# Patient Record
Sex: Male | Born: 1995 | Race: White | Hispanic: No | Marital: Single | Smoking: Never smoker
Health system: Southern US, Community
[De-identification: ages and names within clinical notes are randomized; demographics above are authoritative.]

## PROBLEM LIST (undated history)

## (undated) DIAGNOSIS — E669 Obesity, unspecified: Secondary | ICD-10-CM

---
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
## 2016-01-05 IMAGING — CR DG HAND COMPLETE 3+V*R*
3 series · 3 of 3 positions shown · non-contrast
Comparison: None.

CLINICAL DATA: Hand injury.

EXAM:
RIGHT HAND - COMPLETE 3+ VIEW

[x hand pa right]
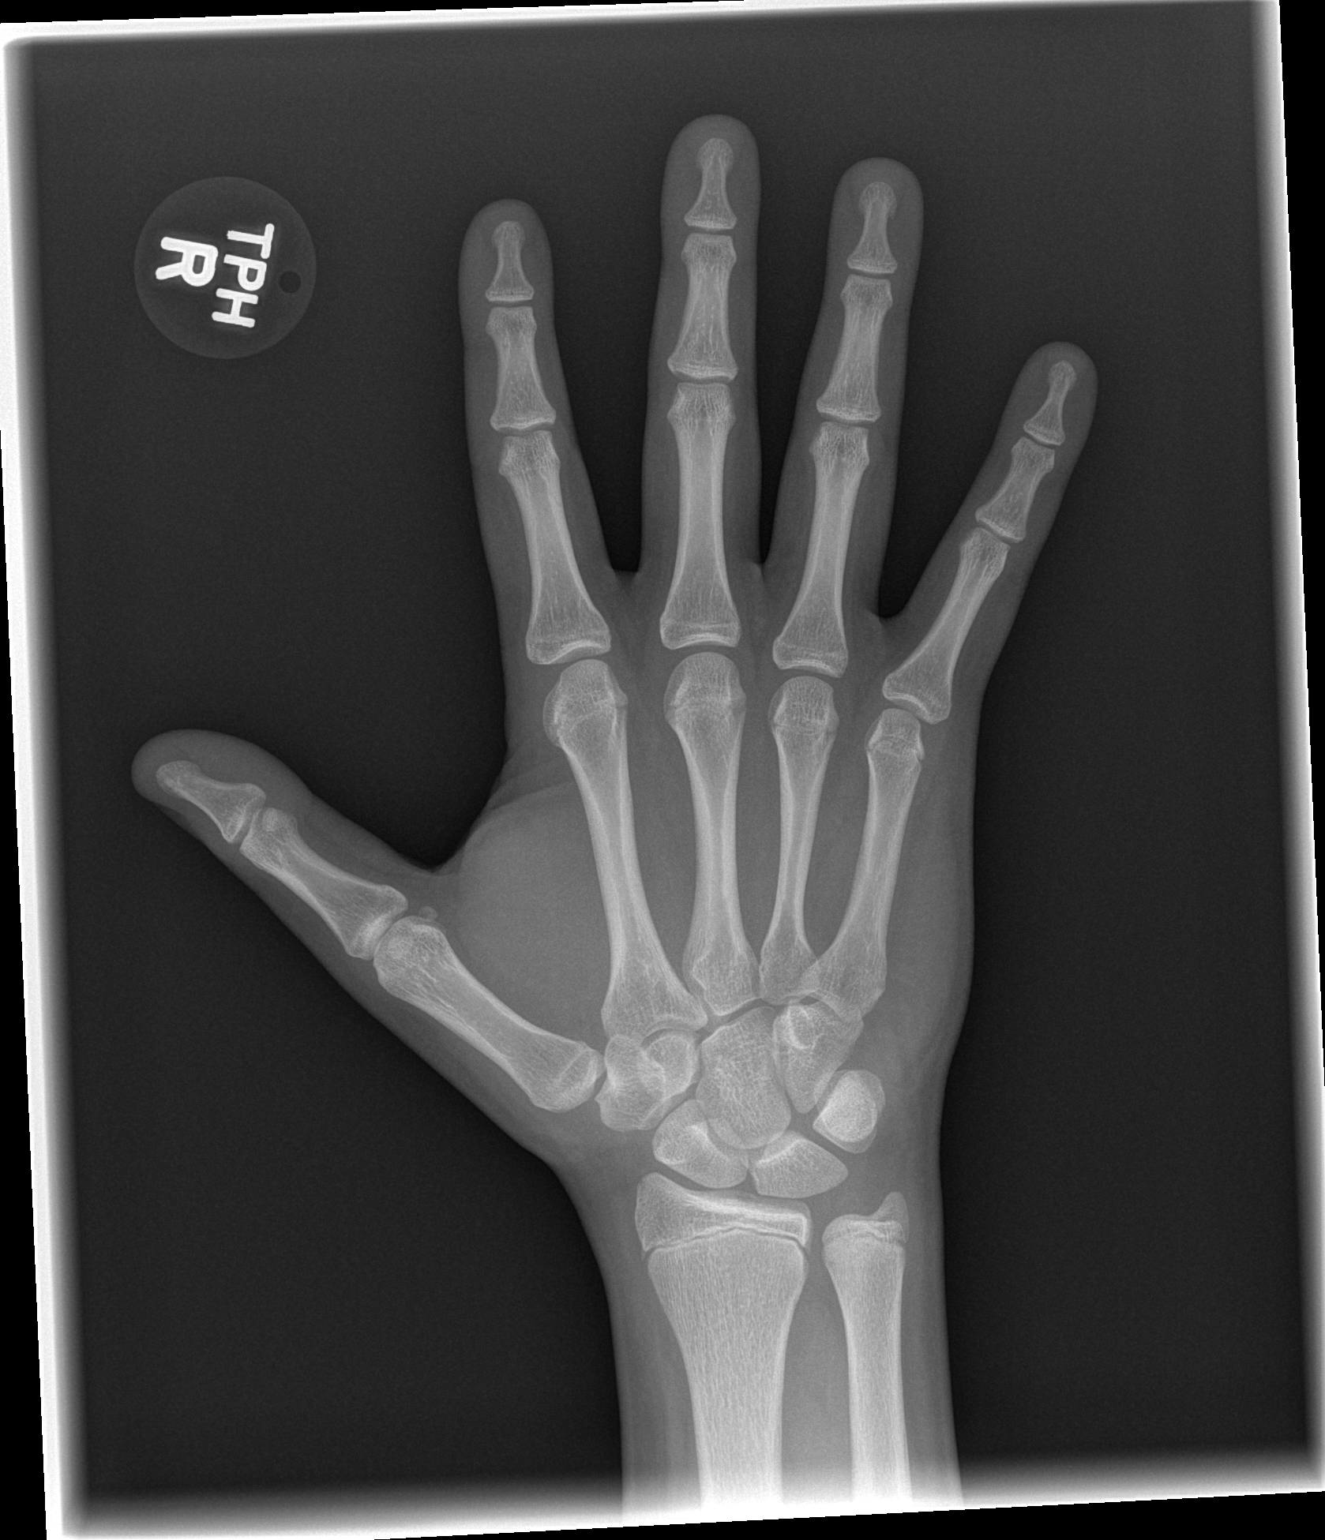

[x hand oblique right]
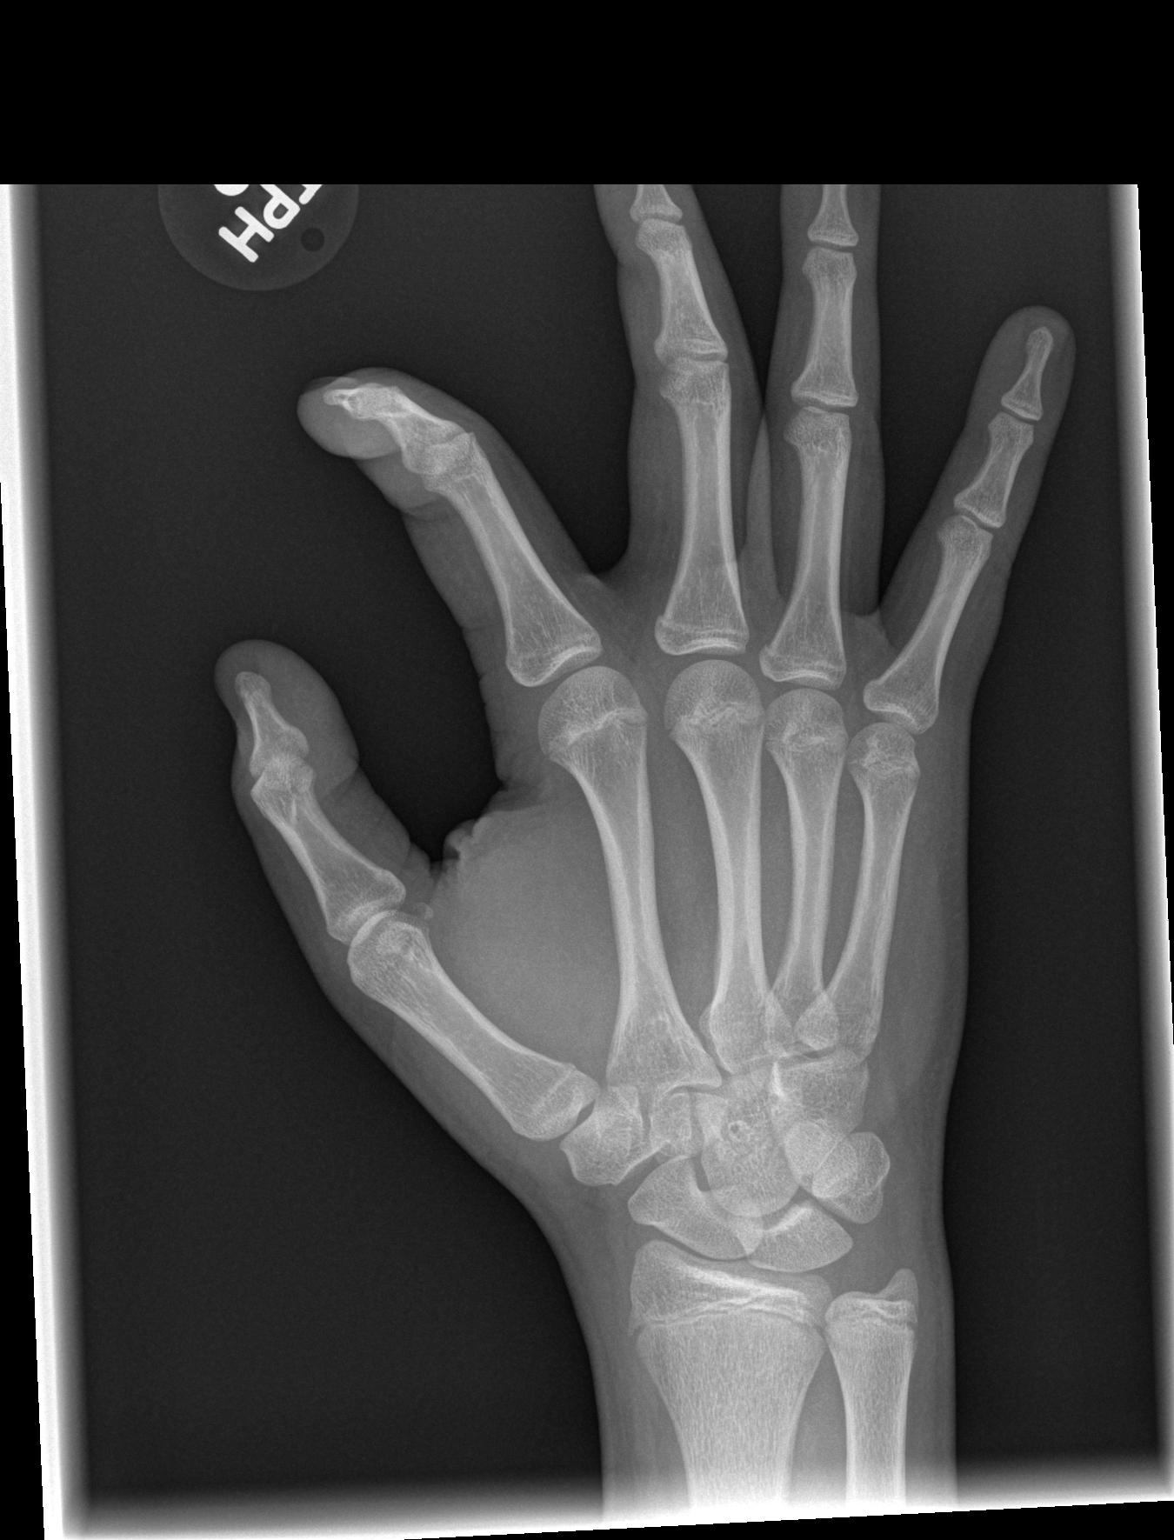

[x hand lat right]
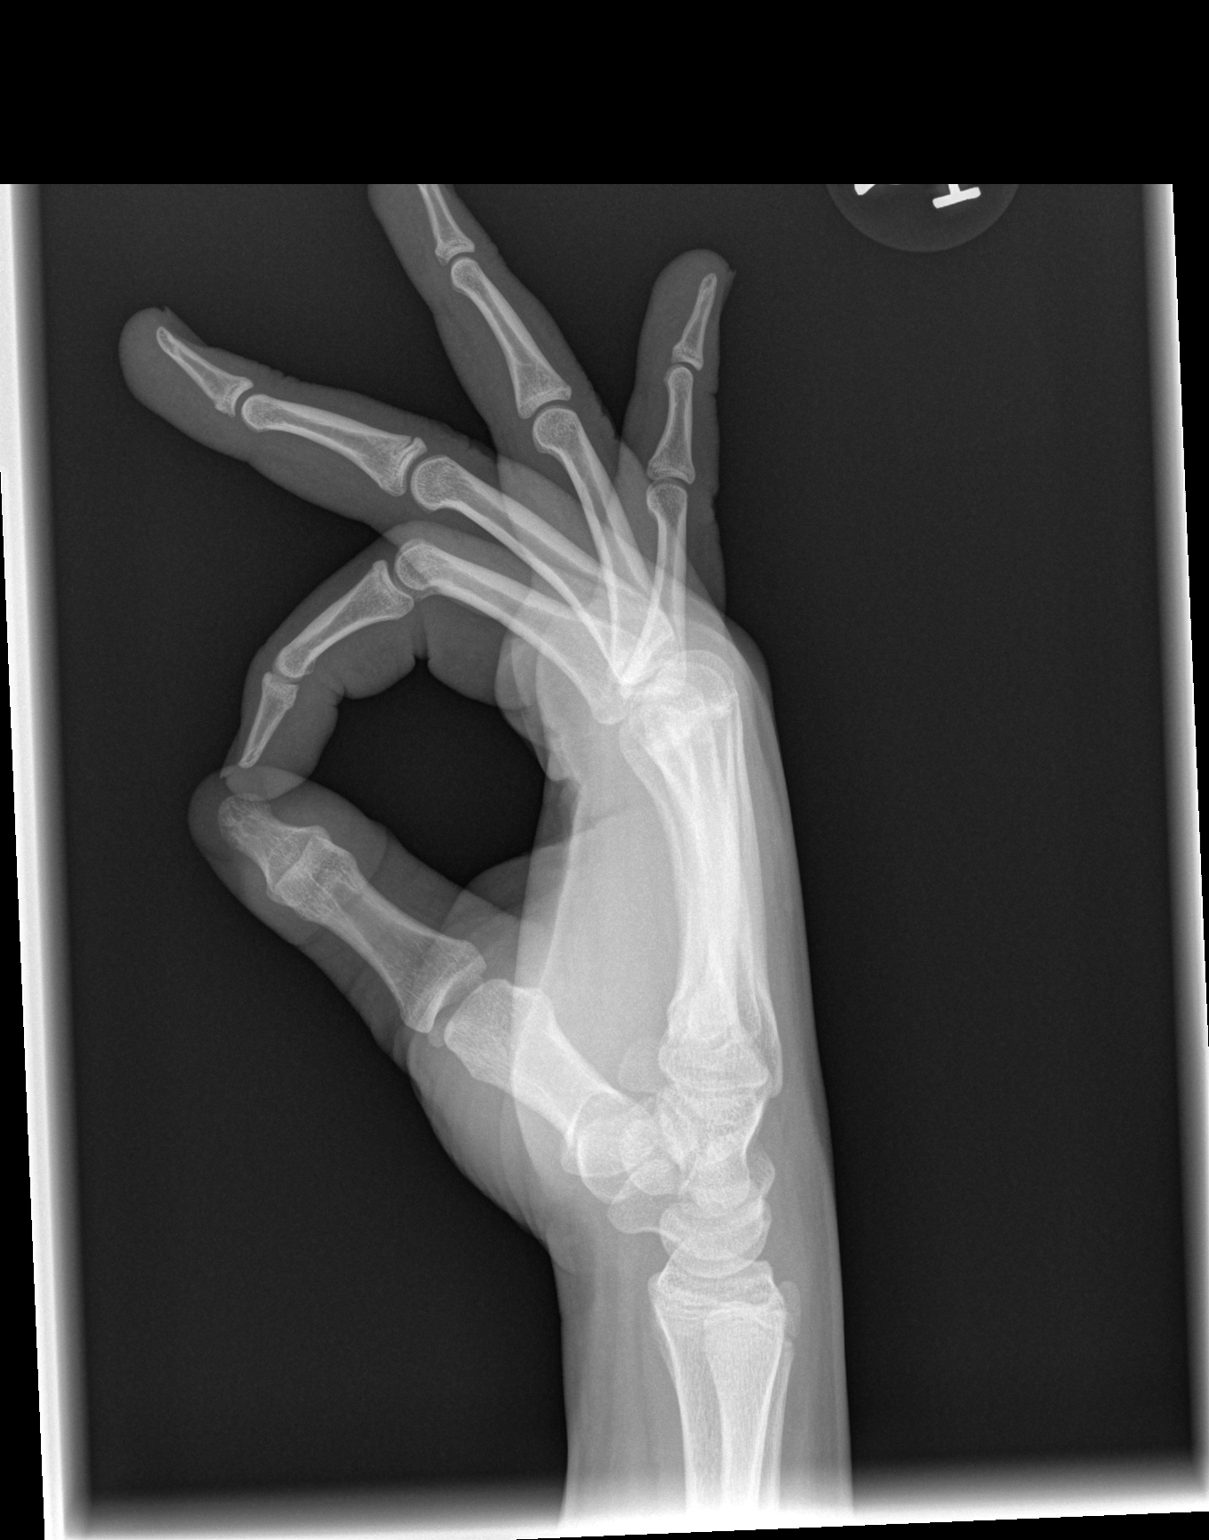

[3 of 3 positions shown; findings below may reference images not displayed]

FINDINGS: There is no evidence of acute fracture or dislocation. Noted physis
remnant in the third middle phalanx. No radiopaque foreign body.
IMPRESSION: No acute osseous abnormality.

## 2016-02-11 ENCOUNTER — Encounter: Payer: Self-pay | Admitting: Pediatrics

## 2016-02-12 ENCOUNTER — Encounter: Payer: Self-pay | Admitting: Pediatrics

## 2019-03-15 ENCOUNTER — Other Ambulatory Visit: Payer: Self-pay

## 2019-03-15 ENCOUNTER — Encounter: Payer: Self-pay | Admitting: Emergency Medicine

## 2019-03-15 ENCOUNTER — Emergency Department
Admission: EM | Admit: 2019-03-15 | Discharge: 2019-03-15 | Disposition: A | Discharge status: Home / Self Care | Payer: Medicaid Other | Attending: Emergency Medicine | Admitting: Emergency Medicine

## 2019-03-15 DIAGNOSIS — X500XXA Overexertion from strenuous movement or load, initial encounter: Secondary | ICD-10-CM | POA: Diagnosis not present

## 2019-03-15 DIAGNOSIS — Y9389 Activity, other specified: Secondary | ICD-10-CM | POA: Diagnosis not present

## 2019-03-15 DIAGNOSIS — S339XXA Sprain of unspecified parts of lumbar spine and pelvis, initial encounter: Secondary | ICD-10-CM | POA: Insufficient documentation

## 2019-03-15 DIAGNOSIS — S39012A Strain of muscle, fascia and tendon of lower back, initial encounter: Secondary | ICD-10-CM

## 2019-03-15 DIAGNOSIS — Y990 Civilian activity done for income or pay: Secondary | ICD-10-CM | POA: Diagnosis not present

## 2019-03-15 DIAGNOSIS — S3992XA Unspecified injury of lower back, initial encounter: Secondary | ICD-10-CM | POA: Diagnosis present

## 2019-03-15 DIAGNOSIS — Y9259 Other trade areas as the place of occurrence of the external cause: Secondary | ICD-10-CM | POA: Diagnosis not present

## 2019-03-15 HISTORY — DX: Obesity, unspecified: E66.9

## 2019-03-15 MED ORDER — BACLOFEN 5 MG PO TABS: 5.0000 mg | ORAL_TABLET | Freq: Three times a day (TID) | ORAL | 0 refills | Status: AC | PRN

## 2019-03-15 NOTE — ED Provider Notes (Signed)
[facility] Emergency Department Provider Note  ____________________________________________   None    (approximate)  I have reviewed the triage vital signs and the nursing notes.   HISTORY  Chief Complaint Back Pain    HPI Troy Morgan is a 23 y.o. male with no chronic medical issues other than obesity  who presents for evaluation of back pain.  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  However about 24 hours ago after working as shift he felt acute onset of worse back pain in the bottom of his lower back.  It is mostly in the middle and nonradiating.  No numbness or weakness in his legs, no numbness around his anus or genitals, no difficulty with ambulation, no difficulty with urination or urinary retention.  The pain feels little bit better when he takes ibuprofen or pushes on the area but he was also concerned he might have some sort of a lump at the bottom of his lower back.  No contact with COVID-19 patients.  Denies fever, sore throat, chest pain, shortness of breath, nausea, and vomiting.        Past Medical History:  Diagnosis Date  . Obesity     There are no active problems to display for this patient.   History reviewed. No pertinent surgical history.  Prior to Admission medications   Medication Sig Start Date End Date Taking? Authorizing Provider  Baclofen 5 MG TABS Take 5 mg by mouth 3 (three) times daily as needed (muscle spasms / low back pain). 03/15/19   Loleta RoseForbach, Najiyah Paris, MD  ibuprofen (ADVIL,MOTRIN) 600 MG tablet Take 1 tablet (600 mg total) by mouth every 6 (six) hours as needed. 12/16/13   Antony MaduraHumes, Kelly, PA-C    Allergies Septra [sulfamethoxazole-trimethoprim]  History reviewed. No pertinent family history.  Social History Social History   Tobacco Use  . Smoking status: Never Smoker  . Smokeless tobacco: Never Used  Substance Use Topics  . Alcohol use: Not on file  . Drug  use: Not on file    Review of Systems Constitutional: No fever/chills ENT: No sore throat. Cardiovascular: Denies chest pain. Respiratory: Denies shortness of breath. Gastrointestinal: No abdominal pain.  No nausea, no vomiting.   Genitourinary: Negative for dysuria.  No urinary retention nor urinary incontinence. Musculoskeletal: Low back pain as described above. Neurological: Negative for headaches, focal weakness or numbness.   ____________________________________________   PHYSICAL EXAM:  VITAL SIGNS: ED Triage Vitals  Enc Vitals Group     BP 03/15/19 0109 123/79     Pulse Rate 03/15/19 0109 87     Resp 03/15/19 0109 20     Temp 03/15/19 0109 98.7 F (37.1 C)     Temp Source 03/15/19 0109 Oral     SpO2 03/15/19 0109 97 %     Weight --      Height 03/15/19 0106 1.829 m (6')     Head Circumference --      Peak Flow --      Pain Score 03/15/19 0106 2     Pain Loc --      Pain Edu? --      Excl. in GC? --     Constitutional: Alert and oriented.  No acute distress. Cardiovascular: Normal rate, regular rhythm. Good peripheral circulation.  Respiratory: Normal respiratory effort.  No retractions. Musculoskeletal: The patient has normal-appearing lower back with no erythema or palpable nodules, induration,  etc.  He indicates the area that he was afraid he might have a lump and it appears to just be adipose tissue.  He has some mild paraspinal muscle tenderness to palpation on both sides but no tenderness to palpation of the lumbar spine itself and no step-offs no deformities. Neurologic:  Normal speech and language. No gross focal neurologic deficits are appreciated.  Ambulatory without difficulty, no weakness nor subjective sensory loss. Skin:  Skin is warm, dry and intact. Psychiatric: Mood and affect are normal. Speech and behavior are normal.  ____________________________________________   LABS (all labs ordered are listed, but only abnormal results are displayed)   Labs Reviewed - No data to display ____________________________________________  EKG  No indication for EKG ____________________________________________  RADIOLOGY I, Hinda Kehr, personally viewed and evaluated these images (plain radiographs) as part of my medical decision making, as well as reviewing the written report by the radiologist.  ED MD interpretation: Medication for imaging  Official radiology report(s): No results found.  ____________________________________________   PROCEDURES   Procedure(s) performed (including Critical Care):  Procedures   ____________________________________________   INITIAL IMPRESSION / MDM / ASSESSMENT AND PLAN / ED COURSE  As part of my medical decision making, I reviewed the following data within the [facility] notes reviewed and incorporated, Old chart reviewed, Notes from prior ED visits and [place] Controlled Substance Database   Well-appearing, no acute distress, no signs or symptoms of emergent low back pain such as cauda equina syndrome, osteomyelitis, transverse myelitis, discitis, etc.  The patient is young and healthy and pulled his back while lifting heavy boxes at work.  What he assumed was a lump is normal tissue.  I encouraged the use of ibuprofen and Tylenol and also wrote him a prescription for a few baclofen.  I gave him information about trying to avoid back injuries in the future.  I gave my usual customary return precautions.          ____________________________________________  FINAL CLINICAL IMPRESSION(S) / ED DIAGNOSES  Final diagnoses:  Strain of lumbar region, initial encounter     MEDICATIONS GIVEN DURING THIS VISIT:  Medications - No data to display   ED Discharge Orders         Ordered    Baclofen 5 MG TABS  3 times daily PRN     03/15/19 0329          *Please note:  WAH SABIC was evaluated in Emergency Department on 03/15/2019 for the symptoms described  in the history of present illness. He was evaluated in the context of the global COVID-19 pandemic, which necessitated consideration that the patient might be at risk for infection with the SARS-CoV-2 virus that causes COVID-19. Institutional protocols and algorithms that pertain to the evaluation of patients at risk for COVID-19 are in a state of rapid change based on information released by regulatory bodies including the CDC and federal and state organizations. These policies and algorithms were followed during the patient's care in the ED.  Some ED evaluations and interventions may be delayed as a result of limited staffing during the pandemic.*  Note:  This document was prepared using Dragon voice recognition software and may include unintentional dictation errors.   Hinda Kehr, MD 03/15/19 0330

## 2019-03-15 NOTE — Discharge Instructions (Signed)
You have been seen in the Emergency Department (ED)  today for back pain.  Your workup and exam have not shown any acute abnormalities and you are likely suffering from muscle strain.  Please take Motrin (ibuprofen) as needed for your pain according to the instructions written on the box.  Alternatively, for the next five days you can take 600mg  three times daily with meals (it may upset your stomach).  Take it along with the prescribed muscle relaxer, but only take the muscle relaxer as needed.  The ibuprofen may be all you need (you can also take Tylenol 1000 mg up to four times a day along with the ibuprofen).  Please follow up with your doctor as soon as possible regarding today's ED visit and your back pain.  Return to the ED for worsening back pain, fever, weakness or numbness of either leg, or if you develop either (1) an inability to urinate or have bowel movements, or (2) loss of your ability to control your bathroom functions (if you start having "accidents"), or if you develop other new symptoms that concern you.

## 2019-03-15 NOTE — ED Triage Notes (Signed)
Pt in with co low back pain states started while lifting boxes at work yesterday.
